# Patient Record
Sex: Female | Born: 1970 | Race: White | Hispanic: No | Marital: Single | Smoking: Never smoker
Health system: Southern US, Community
[De-identification: ages and names within clinical notes are randomized; demographics above are authoritative.]

## PROBLEM LIST (undated history)

## (undated) DIAGNOSIS — Z319 Encounter for procreative management, unspecified: Secondary | ICD-10-CM

## (undated) DIAGNOSIS — E119 Type 2 diabetes mellitus without complications: Secondary | ICD-10-CM

## (undated) DIAGNOSIS — N946 Dysmenorrhea, unspecified: Secondary | ICD-10-CM

## (undated) DIAGNOSIS — E039 Hypothyroidism, unspecified: Secondary | ICD-10-CM

## (undated) HISTORY — PX: CHOLECYSTECTOMY: SHX55

## (undated) HISTORY — PX: GANGLION CYST EXCISION: SHX1691

## (undated) HISTORY — PX: ANKLE SURGERY: SHX546

---

## 2000-07-23 ENCOUNTER — Other Ambulatory Visit: Admission: RE | Admit: 2000-07-23 | Discharge: 2000-07-23 | Payer: Self-pay | Admitting: Obstetrics and Gynecology

## 2000-12-27 ENCOUNTER — Other Ambulatory Visit: Admission: RE | Admit: 2000-12-27 | Discharge: 2000-12-27 | Payer: Self-pay | Admitting: Obstetrics and Gynecology

## 2001-01-29 ENCOUNTER — Encounter (INDEPENDENT_AMBULATORY_CARE_PROVIDER_SITE_OTHER): Payer: Self-pay | Admitting: Specialist

## 2001-01-29 ENCOUNTER — Other Ambulatory Visit: Admission: RE | Admit: 2001-01-29 | Discharge: 2001-01-29 | Payer: Self-pay | Admitting: Obstetrics and Gynecology

## 2001-09-05 ENCOUNTER — Encounter (INDEPENDENT_AMBULATORY_CARE_PROVIDER_SITE_OTHER): Payer: Self-pay | Admitting: Specialist

## 2001-09-05 ENCOUNTER — Encounter (INDEPENDENT_AMBULATORY_CARE_PROVIDER_SITE_OTHER): Payer: Self-pay | Admitting: *Deleted

## 2001-09-05 ENCOUNTER — Ambulatory Visit (HOSPITAL_COMMUNITY): Admission: RE | Admit: 2001-09-05 | Discharge: 2001-09-05 | Payer: Self-pay | Admitting: *Deleted

## 2002-02-07 ENCOUNTER — Other Ambulatory Visit: Admission: RE | Admit: 2002-02-07 | Discharge: 2002-02-07 | Payer: Self-pay | Admitting: Obstetrics and Gynecology

## 2002-06-03 ENCOUNTER — Ambulatory Visit (HOSPITAL_COMMUNITY): Admission: RE | Admit: 2002-06-03 | Discharge: 2002-06-03 | Payer: Self-pay | Admitting: Obstetrics and Gynecology

## 2002-06-03 ENCOUNTER — Encounter: Payer: Self-pay | Admitting: Obstetrics and Gynecology

## 2003-02-26 ENCOUNTER — Other Ambulatory Visit: Admission: RE | Admit: 2003-02-26 | Discharge: 2003-02-26 | Payer: Self-pay | Admitting: Obstetrics and Gynecology

## 2003-09-26 ENCOUNTER — Inpatient Hospital Stay (HOSPITAL_COMMUNITY): Admission: AD | Admit: 2003-09-26 | Discharge: 2003-09-26 | Payer: Self-pay | Admitting: Obstetrics and Gynecology

## 2004-01-13 ENCOUNTER — Inpatient Hospital Stay (HOSPITAL_COMMUNITY): Admission: AD | Admit: 2004-01-13 | Discharge: 2004-01-13 | Payer: Self-pay | Admitting: Obstetrics and Gynecology

## 2004-04-26 ENCOUNTER — Encounter: Admission: RE | Admit: 2004-04-26 | Discharge: 2004-04-26 | Payer: Self-pay | Admitting: *Deleted

## 2004-12-09 ENCOUNTER — Other Ambulatory Visit: Admission: RE | Admit: 2004-12-09 | Discharge: 2004-12-09 | Payer: Self-pay | Admitting: *Deleted

## 2005-07-09 ENCOUNTER — Inpatient Hospital Stay (HOSPITAL_COMMUNITY): Admission: AD | Admit: 2005-07-09 | Discharge: 2005-07-09 | Payer: Self-pay | Admitting: Obstetrics & Gynecology

## 2006-01-26 ENCOUNTER — Ambulatory Visit (HOSPITAL_COMMUNITY): Admission: RE | Admit: 2006-01-26 | Discharge: 2006-01-26 | Payer: Self-pay | Admitting: Obstetrics & Gynecology

## 2006-02-13 ENCOUNTER — Inpatient Hospital Stay (HOSPITAL_COMMUNITY): Admission: AD | Admit: 2006-02-13 | Discharge: 2006-02-16 | Payer: Self-pay | Admitting: Obstetrics and Gynecology

## 2006-08-09 ENCOUNTER — Encounter (INDEPENDENT_AMBULATORY_CARE_PROVIDER_SITE_OTHER): Payer: Self-pay | Admitting: *Deleted

## 2006-08-09 ENCOUNTER — Inpatient Hospital Stay (HOSPITAL_COMMUNITY): Admission: EM | Admit: 2006-08-09 | Discharge: 2006-08-10 | Payer: Self-pay | Admitting: Emergency Medicine

## 2006-08-13 ENCOUNTER — Ambulatory Visit: Payer: Self-pay | Admitting: Gastroenterology

## 2006-09-20 ENCOUNTER — Emergency Department (HOSPITAL_COMMUNITY): Admission: EM | Admit: 2006-09-20 | Discharge: 2006-09-20 | Payer: Self-pay | Admitting: Emergency Medicine

## 2006-10-01 ENCOUNTER — Ambulatory Visit: Payer: Self-pay | Admitting: Gastroenterology

## 2006-10-26 ENCOUNTER — Ambulatory Visit: Payer: Self-pay | Admitting: Gastroenterology

## 2007-10-03 IMAGING — CT CT PELVIS W/ CM
2 of 5 series · 17 of 46 positions shown, 19 images · IV contrast (omnipaque)
Comparison: None

CLINICAL DATA: Abdominal and pelvic pain with increased white count. 
 ABDOMEN CT WITH CONTRAST:
TECHNIQUE: Multidetector CT imaging of the abdomen was performed following the standard protocol during bolus administration of intravenous contrast.
 Contrast:  Oral contrast and 125 cc intravenous Omnipaque 300
TECHNIQUE: Multidetector CT imaging of the pelvis was performed following the standard protocol during bolus administration of intravenous contrast.

[Series 2: abd_pel 5.0 b40f st · axial · 0.69mm/px · z∈[-452,-2]mm · 14 of 101 slices shown, 16 images]
[im 6/101  soft-tissue]
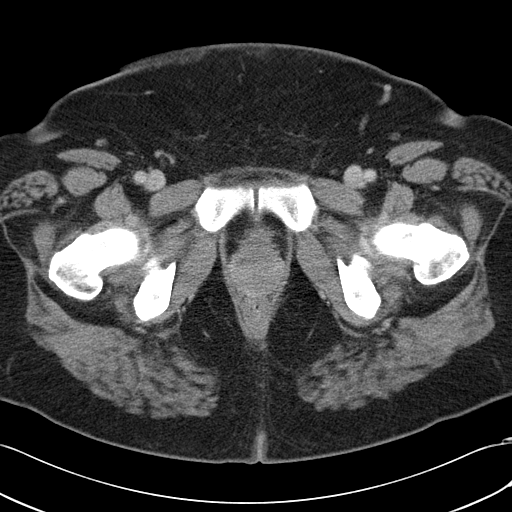
[im 6/101  bone]
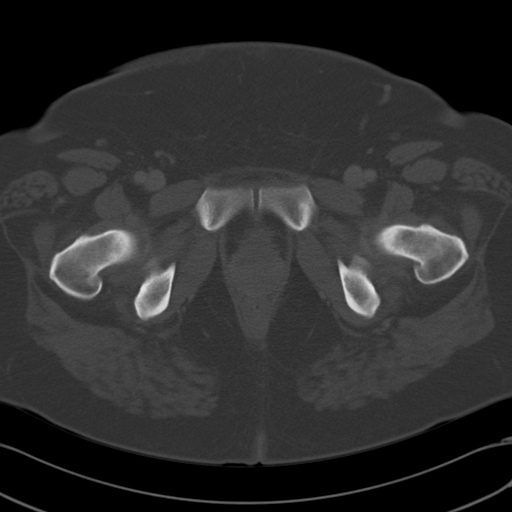
[im 16/101  soft-tissue]
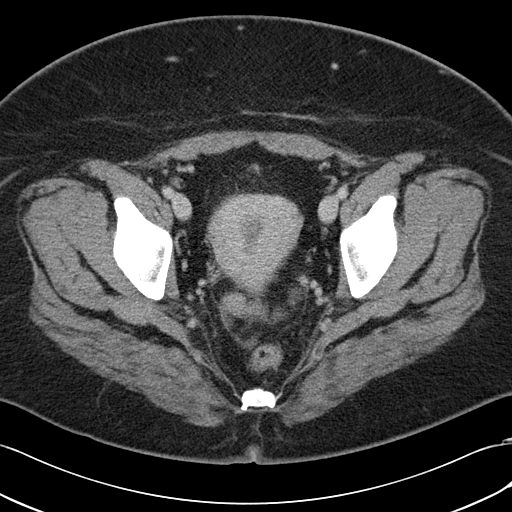
[im 21/101  soft-tissue]
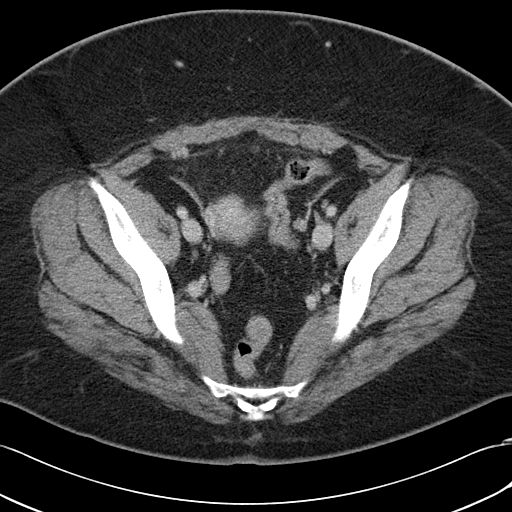
[im 26/101  soft-tissue]
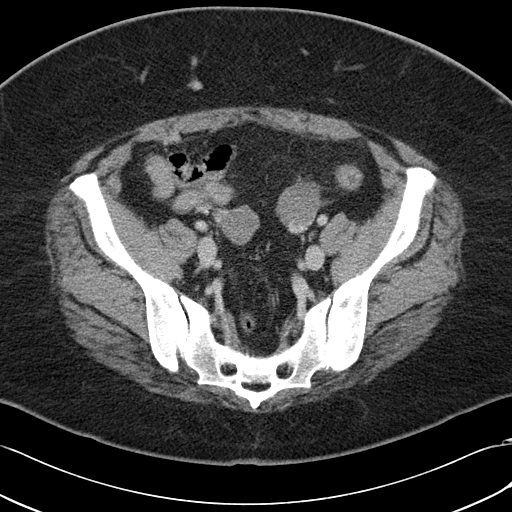
[im 36/101  soft-tissue]
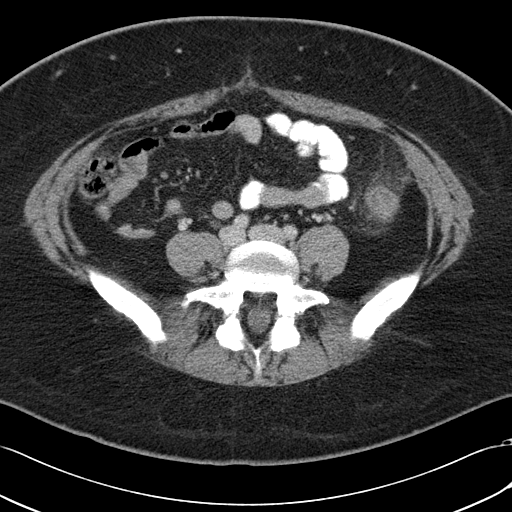
[im 41/101  soft-tissue]
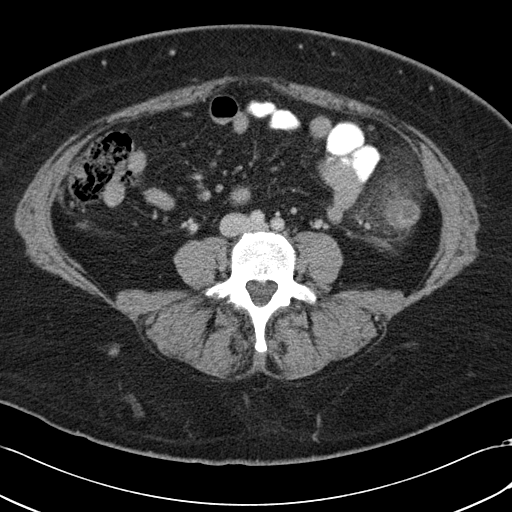
[im 46/101  soft-tissue]
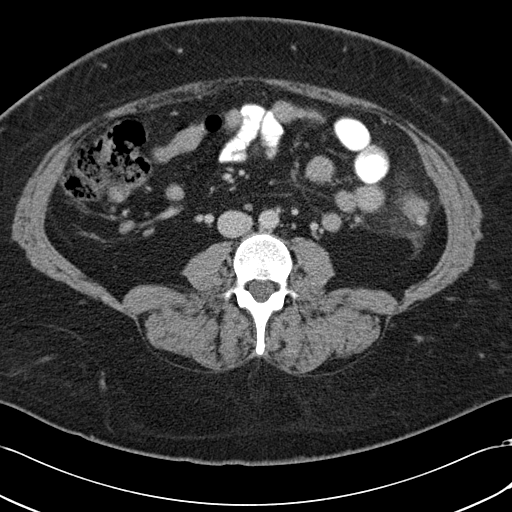
[im 56/101  soft-tissue]
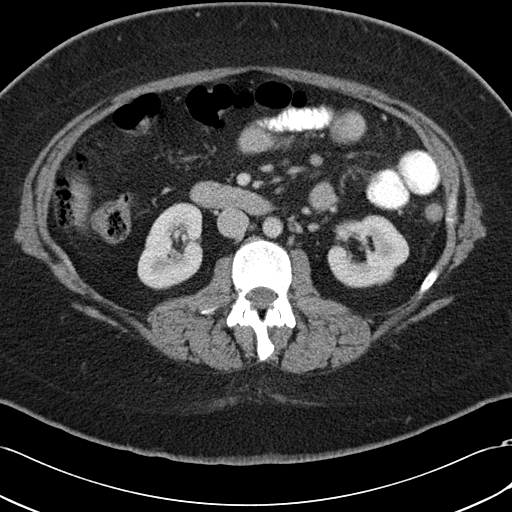
[im 61/101  soft-tissue]
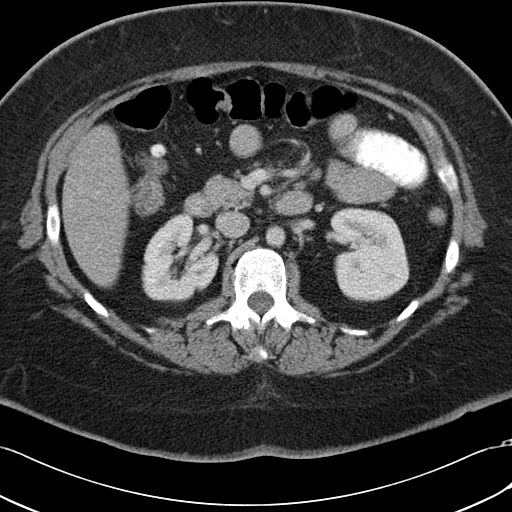
[im 61/101  bone]
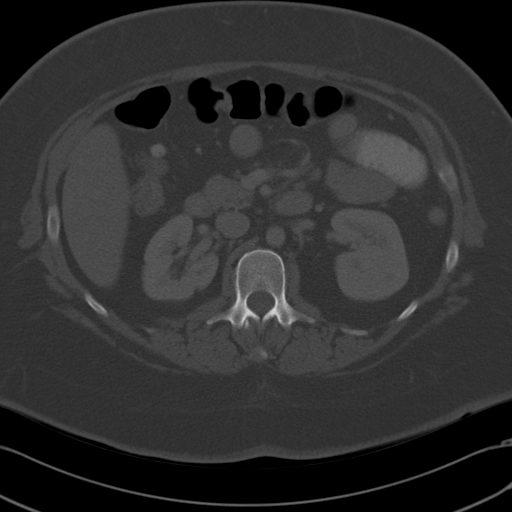
[im 66/101  soft-tissue]
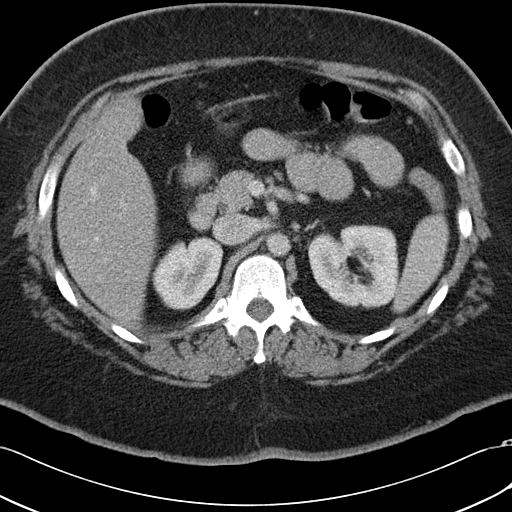
[im 76/101  soft-tissue]
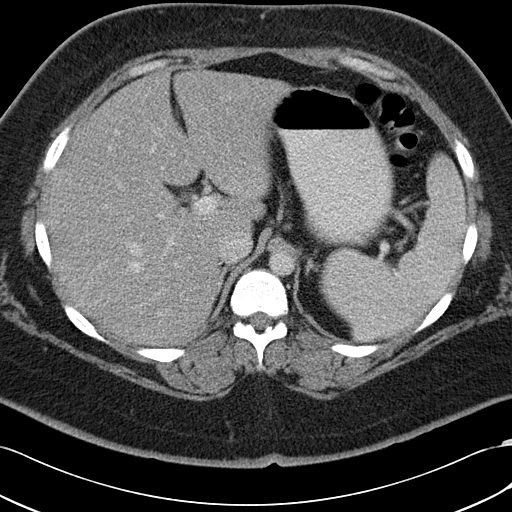
[im 81/101  soft-tissue]
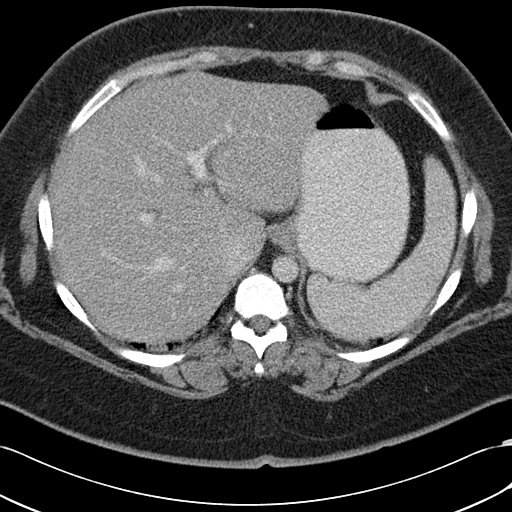
[im 86/101  soft-tissue]
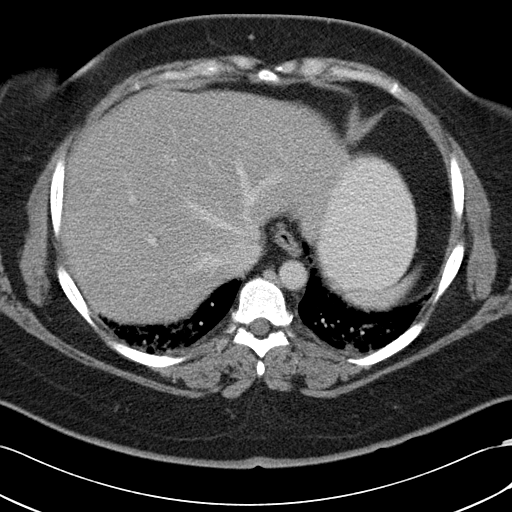
[im 96/101  soft-tissue]
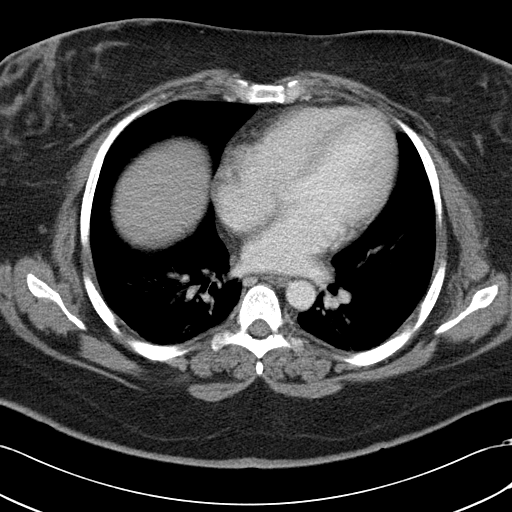

[Series 602: <mpr range> · coronal · 1.03mm/px · 3 of 79 slices shown]
[im 27/79  soft-tissue]
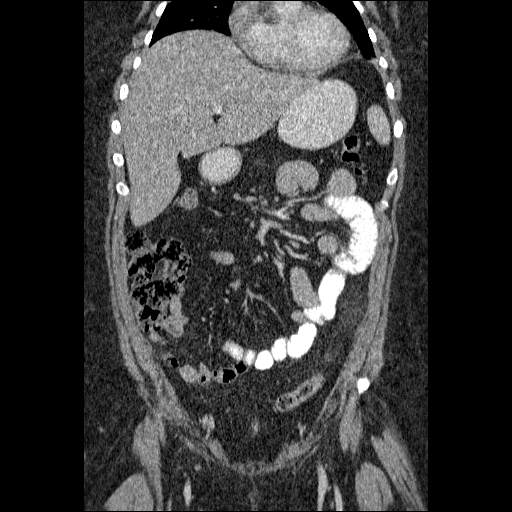
[im 35/79  soft-tissue]
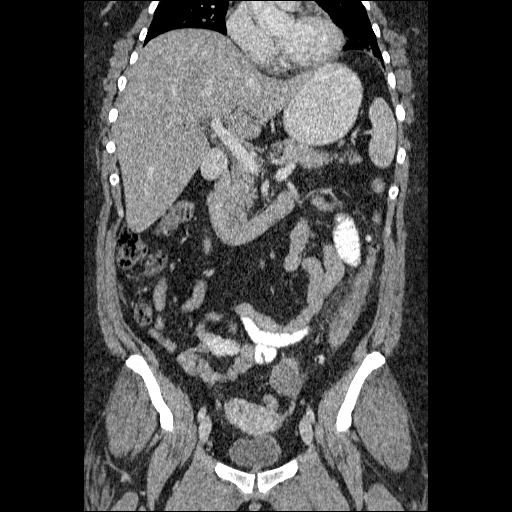
[im 44/79  soft-tissue]
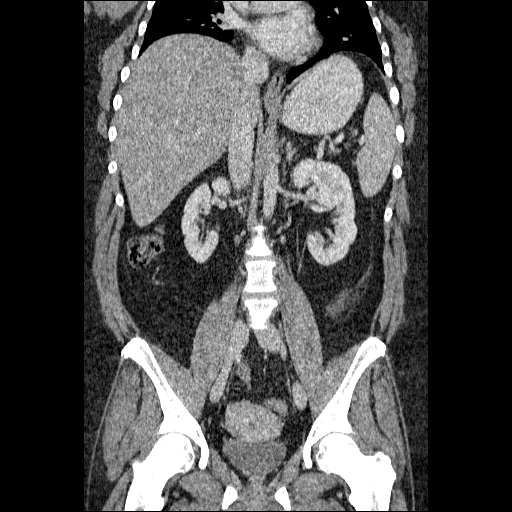

[17 of 46 positions shown; findings below may reference images not displayed]

FINDINGS: Mild bibasilar atelectasis is identified.  Mild fatty infiltration of the liver with mild liver enlargement noted.  The patient is status post cholecystectomy.  The spleen, adrenal glands, kidneys, and pancreas are unremarkable.  There is inflammation and mild wall thickening along the distal descending colon compatible with diverticulitis.  No evidence of pneumoperitoneum, abscess, or bowel obstruction noted.   Multiple colonic diverticula are present.  No evidence of enlarged lymph nodes, free fluid, biliary dilatation, or abdominal aortic aneurysm.  Remainder of the visualized bowel is unremarkable.
IMPRESSION: 1.  Descending colonic diverticulitis without abscess or free air.
 2.  Fatty infiltration of the liver and mild hepatomegaly. 
 PELVIS CT WITH CONTRAST:
FINDINGS: A 2.5 cm right ovarian cyst/follicle is noted.  A tiny amount of free fluid in the pelvis is likely physiologic.  Tiny urachal remnant is present.  Bladder is within normal limits.
 Bilateral L5 pars defects are noted with grade I anterolisthesis of L5 on S1 and severe degenerative disk disease.
IMPRESSION: 1.  No acute abnormality. 
 2.  Bilateral L5 pars defects with grade 1 anterolisthesis of L5 on S1 and severe degenerative disk disease.

## 2010-12-30 NOTE — Discharge Summary (Signed)
Terri [name], Terri [name]               ACCOUNT NO.:  [id number]   MEDICAL RECORD NO.:  [id number]          PATIENT TYPE:  INP   LOCATION:  1322                         FACILITY:  [facility]   PHYSICIAN:  Kari Baars, M.D.  DATE OF BIRTH:  [birth year]   DATE OF ADMISSION:  08/08/2006  DATE OF DISCHARGE:  08/10/2006                               DISCHARGE SUMMARY   DISCHARGE DIAGNOSES:  1. Acute diverticulitis  2. Type 2 diabetes (gestational and postpartum).  3. Hypertension.  4. Hypothyroidism.  5. Recent pregnancy with C-section.  6. Irritable bowel syndrome.  7. Status post knee surgery.   DISCHARGE MEDICATIONS:  1. Cipro 500 mg b.i.d. for 12 days.  2. Flagyl 500 mg t.i.d. for 12 days.  3. Actos 15 mg daily.  4. Synthroid 88 mcg daily.  5. Aldomet 500 mg b.i.d.  6. Hydrochlorothiazide 25 mg - instructed to hold for 1 week and then      resume daily.  7. Vytorin 10/40 - hold until complete antibiotics then resume daily.   HOSPITAL PROCEDURES:  1. CT of the abdomen and pelvis (12/27 descending colonic      diverticulitis without abscess or free air.  Fatty infiltration of      liver with mild hepatomegaly.  Bilateral L5 pars defects with grade      1 anterolisthesis of L5 on S1 and severe degenerative disk disease.   HISTORY OF PRESENT ILLNESS:  For full details please see dictated  history and physical by Dr. Evlyn Kanner.  Briefly, Keron Koffman is a pleasant  40 year old white female with a history of type 2 diabetes that began as  gestational diabetes during her recent pregnancy, hypertension, and  hypothyroidism who presented to the emergency department 12/27 with  acute onset of abdominal pain.  She states that she has had irritable  bowel symptoms for the past week with intermittent abdominal pain.  However, on the day of presentation.  She had a severe onset of left  lower quadrant abdominal pain which was severe in nature.  Associated  symptoms included fever, nausea, bowel  movements were normal with no  apparent blood.  She came to the emergency department where she was  found to have an elevated white blood count of 22,000.  Urine pregnancy  test and lipase were also negative.  Urinalysis normal.  CT scan was  performed with results as above consistent with acute diverticulitis.  Given the severity of her abdominal pain and CT findings.  The patient  was admitted for further management.   HOSPITAL COURSE:  The patient was admitted to a medical bed and treated  for acute diverticulitis.  She was placed on IV Cipro and Flagyl.  She  was placed on a clear liquid diet.  Given the atypical presentation at a  young age, GI was consulted.  They agreed with the diagnosis and plan an  outpatient evaluation once her symptoms size with a colonoscopy.  Interestingly, she states that her mother had onset of diverticulitis in  her 30s.  She does have a brother with Crohn's disease.  She has not  had  any other abnormal GI symptoms, weight loss, or diarrhea to suggest  inflammatory bowel disease.  With antibiotic therapy and bowel rest, her  symptoms rapidly improved.  On the day following admission she was  tolerating an advanced diet and oral antibiotics.  Her abdominal  tenderness had resolved significantly.  Her leukocytosis also improved.  Therefore, she was felt stable for discharge home to complete an  outpatient course of p.o. antibiotics.  Following completion of her  antibiotics, she will undergo colonoscopy at [facility].   DISCHARGE LABS:  On the day of discharge white count 11.4, hemoglobin  12, platelets 279.  BMP showed a sodium 137, potassium 3.4, chloride  103, bicarb 27, BUN 2, creatinine 0.7, glucose 131.  A1c was 7.   DISPOSITION:  The patient was discharged to home.   DISCHARGE INSTRUCTIONS:  She was instructed to call if she has a fever  above 101.5, increasing abdominal pain, inability to tolerate p.o.  She  should monitor her sugars and resume  her Actos.  She was instructed to  hold her HCTZ until her dehydration resolved completely.   DISCHARGE DIET:  Diabetic diet as tolerated.  She was instructed to  avoid high residue foods including seeds, nuts, corn/popcorn.   HOSPITAL FOLLOW-UP:  She will follow up with Dr. Arlyce Dice within the next  1-2 weeks.  She should follow-up with Dr. Evlyn Kanner in 2 weeks as well.      Kari Baars, M.D.  Electronically Signed     WS/MEDQ  D:  08/10/2006  T:  08/11/2006  Job:  [id number]   cc:   Jeannett Senior A. Evlyn Kanner, M.D.  Fax: [phone]   Barbette Hair. Arlyce Dice, MD,FACG  [address]  [place]  [place] [address]

## 2010-12-30 NOTE — H&P (Signed)
Terri [name], Terri [name]               ACCOUNT NO.:  [id number]   MEDICAL RECORD NO.:  [id number]          PATIENT TYPE:  INP   LOCATION:  1322                         FACILITY:  [facility]   PHYSICIAN:  Tera Mater. [place], M.D. DATE OF BIRTH:  13-Oct-1970   DATE OF ADMISSION:  08/08/2006  DATE OF DISCHARGE:                              HISTORY & PHYSICAL   HISTORY OF PRESENT ILLNESS:  This is a 40 year old white female with a  history of type 2 diabetes, hypertension, hypothyroidism who presents  for evaluation.  She had been sick for roughly one week and thought her  irritable bowel syndrome had been acting up.  She had intermittent  abdominal pain, more on the left side but going across the abdomen.  This was a fleeting pain for a few minutes at a time.  She had some  general malaise.  Bowels were unpredictable as usual.  She had seen no  blood in her bowels.  Starting yesterday, however the pain intensified  and did not go away.  She was doubled over with pain and had some  fever.  She went to the urgent care and had x-rays and labs reportedly  with findings of a leukocytosis.  She was sent here and has been  evaluated in the Emergency Room, had a CT scan suggesting  diverticulitis.  The patient has not thrown up any but was nauseated.  She has had no breathing trouble, no chest pain, no neurologic symptoms.  She has had general weakness and feeling bad.  Her p.o. intake has been  down.  She denies any other new symptoms.  She has never had any GI  investigation or GI problems.  At the present time she has had pain  medications and is feeling quite a bit better.   PAST MEDICAL HISTORY:  Includes a recent pregnancy that ended on 02/13/06.  She has had gestational and type 2 diabetes.  She is on oral agents now.  She has chronic hypertension, both pre and during pregnancy.  She has  hypothyroidism.  She has previously had in vitro fertilization.  She had  C section with this last surgery.  She  has irritable bowel syndrome.  She has had a procedure on her knee.   FAMILY HISTORY:  Positive for diabetes, heart disease and coronary  disease.   MEDICATIONS:  At present include Synthroid 88 mcg; Aldomet 500 twice  daily; hydrochlorothiazide 25 daily; prenatal vitamin and Vytorin 10/40.   ALLERGIES:  None.   SOCIAL HISTORY:  Reveals she is married with two children.   PHYSICAL EXAMINATION:  VITAL SIGNS:  Blood pressure 130/77, pulse 98,  respirations 20, temperature 98.2.  In the Emergency Room at  presentation, temperature was 99.4.  O2 sat is 91%.  Weight is 114 kilo  with a height of 6 foot 2 inches.  GENERAL:  We have a fairly healthy, non-toxic appearing white female  lying quietly in bed, no distress.  Acne rosacea is present.  Sclerae  anicteric.  Extraocular movements intact.  Oral mucous membranes are  moist.  NECK:  Supple without bruits.  No thyromegaly is present.  LUNGS:  Clear without wheezes, rales, or rhonchi.  No accessory muscles  in use.  HEART:  Regular with a soft flow murmur 1/6.  ABDOMEN:  Mildly distended.  She has hypoactive bowel sounds but present  in all four quadrants.  She is tender, especially in the left lower and  left mid abdomen and along the left side.  She does have discomfort when  movement is noted.  EXTREMITIES:  Strong distal pulses without edema.  NEUROLOGIC:  The patient is awake and alert, mentating perfectly well.  Speech is clear.  No tremors present.  No evidence of hallucinations or  delusions present.   LABORATORY DATA:  White count is 22,300, hemoglobin 15.0, platelets  415,000.  MCV 86.6.  Her sodium is 138, potassium 3.1, chloride 97, CO2  31, BUN 7, creatinine 0.8, glucose 150.  Total bilirubin is 0.4.  Alkaline phosphatase is 58.  SGOT is 25, SGPT 27, total protein 8,  albumin 3.9, calcium 9.6, lipase 31.  Urine pregnancy test is negative.  Urinalysis was negative.   CT scan of the abdomen and pelvis reveals descending  colonic  diverticulitis without abscess or free air, fatty infiltration of the  liver, mild hepatomegaly is noted.  There is an ovarian cyst at 2.5 cm  also noted.  There is also degenerative joint disease at L-5 and S-1.   SUMMARY:  In summary we have a 40 year old white female presenting with  acute diverticulitis.  She has already received Cipro and Flagyl and  this will be continued.  White count will be followed serially.  I do  not see any indication for surgical consult at this time.  Will set her  up with GI consult because she will need some colonic investigation  after this has settled down some.  She will be kept NPO for at least the  next 24 hours.  A sliding scale of insulin will be used.  Her Aldomet  will be continued.  Her Synthroid will be continued.  Will hold the  Vytorin and hydrochlorothiazide.  Cipro and Flagyl will be used as  antibiotic choices.  She will have her diet advanced based on clinical  situation.           ______________________________  Tera Mater Evlyn Kanner, M.D.     SAS/MEDQ  D:  08/09/2006  T:  08/09/2006  Job:  [id number]

## 2010-12-30 NOTE — Letter (Signed)
October 01, 2006    Jeannett Senior A. Evlyn Kanner, M.D.  [address]  [place], [place] [address]   RE:  RYLEEANN, URQUIZA  MRN:  [id number]  /  DOB:  [birth year]   Dear Dr. Evlyn Kanner:   Upon your kind referral, I had the pleasure of evaluating your patient  and I am pleased to offer my findings.  I saw Chundra Sauerwein in the  office today.  Enclosed is a copy of my progress note that details my  findings and recommendations.   Thank you for the opportunity to participate in your patient's care.    Sincerely,      Barbette Hair. Arlyce Dice, MD,FACG  Electronically Signed    RDK/MedQ  DD: 10/01/2006  DT: 10/01/2006  Job #: [id number]

## 2010-12-30 NOTE — Discharge Summary (Signed)
NAMECODI, FOLKERTS NO.:  [id number]   MEDICAL RECORD NO.:  [id number]          PATIENT TYPE:  INP   LOCATION:                                FACILITY:  [facility]   PHYSICIAN:  Miguel Aschoff, M.D.            DATE OF BIRTH:   DATE OF ADMISSION:  02/13/2006  DATE OF DISCHARGE:  02/16/2006                                 DISCHARGE SUMMARY   FINAL DIAGNOSES:  1.  Intrauterine pregnancy at 37+ weeks gestation.  2.  Insulin dependent gestational diabetes mellitus.  3.  Chronic hypertension.  4.  Hypothyroidism.  5.  Advanced maternal age.  6.  History of in vitro fertilization.  7.  Failed induction of labor.   PROCEDURE:  Primary low flap transverse cesarean section.   SURGEON:  Dr. Miguel Aschoff.   COMPLICATIONS:  None.   This 40 year old G-3, P-0-0-2-0 presents at 37-4/[redacted] weeks gestation for  induction of labor.  The patient has insulin-dependent diabetes and she had  been having a BPPs performed in the office twice weekly, which were all  normal.  The patient's blood sugars were in pretty good control with her  insulin regimen.  The patient also has a history of chronic hypertension.  She was on Aldomet during this pregnancy and blood pressures were stable.  The patient is also advanced maternal age.  She also has hypothyroidism  and  was on  Synthroid and had her TSHs and T4s checked throughout this  pregnancy.  The patient was admitted for induction of labor and was started  on IV Pitocin.  She continued on this throughout the day with no change in  her cervix.  After 10 hours, she was only fingertip dilated and -2 station.  Because of this prolonged induction and multiple medical problems, a  discussion was held with the patient and her husband and a decision was made  to proceed with a cesarean section.  She was taken to the operating room on  February 13, 2006 by Dr. Miguel Aschoff, where a primary low flap transverse cesarean  section was performed with the delivery of  an 8 pound 2 ounce female infant  with Apgars of 8 and 9.  Delivery went without complications.  The patient's  postoperative course was benign without any significant fevers.  She was on  the Glucommander postoperatively.  By postoperative day #3, the patient was  felt ready for discharge and was sent home on a regular diet, told to  decrease her activities and was started on Actos 15 mg daily for elevated  blood sugars, was given Tylox 1 every 3 hours as needed for pain, told she  could use Motrin up to 600 mg every 6 hours as  needed for pain, told to take iron for some postoperative anemia and was to  follow up in our office in a week.  Of course to call with any increased  bleeding, pain or problems.   LABS ON DISCHARGE:  The patient had a hemoglobin of 9.6, white blood cell  count of 10.6,  platelets of 254,000.      Leilani Able, P.A.-C.      Miguel Aschoff, M.D.  Electronically Signed    MB/MEDQ  D:  03/12/2006  T:  03/12/2006  Job:  [id number]

## 2010-12-30 NOTE — Letter (Signed)
October 01, 2006    Terri [name]   RE:  Terri [name], Terri [name]  MRN:  [id number]  /  DOB:  04-12-1971   Dear [name]:   It is my pleasure to have treated you recently as a new patient in my  office.  I appreciate your confidence and the opportunity to participate  in your care.   Since I do have a busy inpatient endoscopy schedule and office schedule,  my office hours vary weekly.  I am, however, available for emergency  calls every day through my office.  If I cannot promptly meet an urgent  office appointment, another one of our gastroenterologists will be able  to assist you.   My well-trained staff are prepared to help you at all times.  For  emergencies after office hours, a physician from our gastroenterology  section is always available through my 24-hour answering service.   While you are under my care, I encourage discussion of your questions  and concerns, and I will be happy to return your calls as soon as I am  available.   Once again, I welcome you as a new patient and I look forward to a happy  and healthy relationship.    Sincerely,      Barbette Hair. Arlyce Dice, MD,FACG  Electronically Signed   RDK/MedQ  DD: 10/01/2006  DT: 10/01/2006  Job #: [id number]

## 2010-12-30 NOTE — Assessment & Plan Note (Signed)
[name]                         GASTROENTEROLOGY OFFICE NOTE   NAME:Terri [name], [name]                      MRN:          [id number]  DATE:10/01/2006                            DOB:          1970/12/24    REASON FOR CONSULTATION:  Abdominal pain.   Mrs. Terri Terri [name] is a pleasant, 40 year old, white female referred through  the courtesy of Dr. Evlyn Terri [name] for evaluation.  In December 2007, she was  hospitalized with acute diverticulitis.  She has a history of IBS  characterized by constipation alternating with diarrhea.  Diverticulitis  was diagnosed by CT.  Since her discharge, she has done well.  She did  develop recurrent abdominal pain, recurrent back pain which was  determined to be due to the stress fracture.  She currently is without  abdominal pain.  There is no history of melena or hematochezia.  She  apparently underwent colonoscopy approximately 5 years ago.   FAMILY HISTORY:  Pertinent for a grandfather with colon cancer and  father, and 24 year old brother with colon polyps.  Another brother has  Crohn's disease.   PAST MEDICAL HISTORY:  Pertinent for hypertension and diabetes and  thyroid disease.  She is status post cholecystectomy.  She has  depression.  Family history is as stated above.  Mother has heart  disease.   MEDICATIONS:  Include Synthroid, Actos, Methyldopa, Vytorin,  hydrochlorothiazide.   She has no allergies.   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   REVIEW OF SYSTEMS:  Positive for back pain.   EXAM:  Pulse 90.  Blood pressure 122/84.  Weight 250.  HEENT: EOMI. PERRLA. Sclerae are anicteric.  Conjunctivae are pink.  NECK:  Supple without thyromegaly, adenopathy or carotid bruits.  CHEST:  Clear to auscultation and percussion without adventitious  sounds.  CARDIAC:  Regular rhythm; normal S1 S2.  There are no murmurs, gallops  or rubs.  ABDOMEN:  Bowel sounds are normoactive.  Abdomen is soft, non-tender and  non-distended.  There are no abdominal masses, tenderness, splenic  enlargement or hepatomegaly.  EXTREMITIES:  Full range of motion.  No cyanosis, clubbing or edema.  RECTAL:  Deferred.   IMPRESSION:  62. A 41 year old female with recent history of acute diverticulitis.  2. Family history of colon cancer and colon polyps and Crohn's      disease.  3. Irritable bowel syndrome.   RECOMMENDATIONS:  1. Fiber supplementation.  2. Colonoscopy to determine extent and the severity of her      diverticular disease and to rule out colon polyps.     Terri Terri [name]. Terri Dice, MD,FACG  Electronically Signed    RDK/MedQ  DD: 10/01/2006  DT: 10/01/2006  Job #: [id number]   cc:   Terri Terri [name] A. Terri Terri [name], M.D.

## 2010-12-30 NOTE — Op Note (Signed)
NAME:  Terri [name], FRANE               ACCOUNT NO.:  [id number]   MEDICAL RECORD NO.:  [id number]          PATIENT TYPE:  INP   LOCATION:  9122                          FACILITY:  [facility]   PHYSICIAN:  Miguel Aschoff, M.D.       DATE OF BIRTH:  04/09/1971   DATE OF PROCEDURE:  02/13/2006  DATE OF DISCHARGE:                                 OPERATIVE REPORT   PREOPERATIVE DIAGNOSIS:  Intrauterine pregnancy at 37+ weeks, insulin  dependent diabetes mellitus, chronic hypertension, hypothyroidism, pregnancy  as a result en vitro fertilization, advanced maternal age, failed induction  of labor.   POSTOPERATIVE DIAGNOSIS:  Intrauterine pregnancy at 37+ weeks, insulin  dependent diabetes mellitus, chronic hypertension, hypothyroidism, pregnancy  as a result en vitro fertilization, advanced maternal age, failed induction  of labor, with delivery of a female infant Apgars 8/9.   PROCEDURE:  Primary low flap transverse cesarean section.   SURGEON:  Miguel Aschoff, M.D.   ANESTHESIA:  Spinal.   COMPLICATIONS:  None.   JUSTIFICATION:  The patient is a 40 year old white female gravida 3, para 0-  0-2-0, at 6 and 4/7 weeks who was admitted for induction of labor. The  patient is an insulin dependent diabetic also with chronic hypertension and  hypothyroidism.  The patient was started on the intravenous Pitocin and was  continued on Pitocin throughout the day with essentially no change in her  cervix. After 10 hours of Pitocin, she was still only fingertip dilated, 30%  effaced, and -2 station.  Because of her multiple medical problems, the  option of proceeding with a second day of induction versus cesarean section  were discussed with the patient and her husband and they have opted at this  point to undergo primary cesarean section.  The risks and benefits were  discussed.   PROCEDURE:  The patient is taken to the operating room, placed in the  sitting position, and spinal anesthesia was administered  without difficulty.  She was then placed in supine position, deviated to the left, prepped and  draped in the usual sterile fashion.  A Foley catheter was inserted.  A  Pfannenstiel incision was then made and extended down through the  subcutaneous tissue with bleeding points being found, clamped and coagulated  as they were encountered.  The fascia was then found, incised transversely,  and then separated from the underlying rectus muscles.  The rectus muscles  were divided in the midline.  The peritoneum was then found and entered  carefully avoiding underlying structures, then extended under direct  visualization.  The bladder flap was created and then protected with the  bladder blade.  An elliptical transverse incision was then made into the  lower uterine segment and the amniotic cavity was entered, clear fluid was  obtained. At this point, the patient was delivered of a viable female infant  Apgar 8 at 1 minute and 9 at 5 minutes from a vertex ROA position.  The baby  weighed 8 pounds 2 ounces.  It was noted after evaluation by the  neonatologist that the umbilical cord only contained two  vessels.  There  were no dysmorphic stigmata noted.  The placenta was then delivered.  The  uterus was evacuated of remaining products of conception.  The angles of the  uterine incision were then ligated using figure-of-eight sutures of #1  Vicryl.  The uterus was closed in layers, the first layer was a running  interlocking suture of #1 Vicryl followed by an imbricating suture of #1  Vicryl.  The bladder flap was reapproximated using running continuous 2-0  Vicryl suture.  At this point, with good hemostasis, the lap counts and  instrument counts were checked and found to be correct, the abdomen was  irrigated with warm saline and then closed.  The parietal peritoneum was  closed using running continuous 0 Vicryl suture.  The rectus muscles were  reapproximated using running continuous 0 Vicryl  suture.  The fascia was  closed using two sutures of 0 Vicryl each starting at the lateral fascial  angles and meeting in midline.  The subcutaneous tissues were closed using  interrupted 0 Vicryl.  The skin incision was closed using staples.  Estimated blood loss was approximately 600 mL.  The patient tolerated the  procedure well and went to recovery in satisfactory condition.  The baby was  taken to the nursery in satisfactory condition.   The plan is for the mother to remain on the Glucocomander protocol in the  immediate postoperative period and then change back to insulin when on a  regular diet.      Miguel Aschoff, M.D.  Electronically Signed     AR/MEDQ  D:  02/13/2006  T:  02/13/2006  Job:  [id number]

## 2015-03-08 ENCOUNTER — Other Ambulatory Visit (HOSPITAL_COMMUNITY)
Admission: RE | Admit: 2015-03-08 | Discharge: 2015-03-08 | Disposition: A | Discharge status: Home / Self Care | Payer: Self-pay | Source: Ambulatory Visit | Attending: Family Medicine | Admitting: Family Medicine

## 2015-03-08 DIAGNOSIS — Z1151 Encounter for screening for human papillomavirus (HPV): Secondary | ICD-10-CM | POA: Insufficient documentation

## 2015-03-08 DIAGNOSIS — Z01411 Encounter for gynecological examination (general) (routine) with abnormal findings: Secondary | ICD-10-CM | POA: Insufficient documentation

## 2015-09-27 ENCOUNTER — Encounter (HOSPITAL_COMMUNITY): Payer: Self-pay | Admitting: *Deleted

## 2015-09-27 ENCOUNTER — Inpatient Hospital Stay (HOSPITAL_COMMUNITY): Payer: Self-pay

## 2015-09-27 ENCOUNTER — Inpatient Hospital Stay (HOSPITAL_COMMUNITY)
Admission: AD | Admit: 2015-09-27 | Discharge: 2015-09-27 | Disposition: A | Discharge status: Home / Self Care | Payer: Self-pay | Source: Ambulatory Visit | Attending: Family Medicine | Admitting: Family Medicine

## 2015-09-27 DIAGNOSIS — K589 Irritable bowel syndrome without diarrhea: Secondary | ICD-10-CM | POA: Insufficient documentation

## 2015-09-27 DIAGNOSIS — R1032 Left lower quadrant pain: Secondary | ICD-10-CM | POA: Insufficient documentation

## 2015-09-27 DIAGNOSIS — E039 Hypothyroidism, unspecified: Secondary | ICD-10-CM | POA: Insufficient documentation

## 2015-09-27 DIAGNOSIS — E119 Type 2 diabetes mellitus without complications: Secondary | ICD-10-CM | POA: Insufficient documentation

## 2015-09-27 HISTORY — DX: Dysmenorrhea, unspecified: N94.6

## 2015-09-27 HISTORY — DX: Type 2 diabetes mellitus without complications: E11.9

## 2015-09-27 HISTORY — DX: Hypothyroidism, unspecified: E03.9

## 2015-09-27 HISTORY — DX: Encounter for procreative management, unspecified: Z31.9

## 2015-09-27 LAB — CBC
HCT: 34.4 % — ABNORMAL LOW (ref 36.0–46.0)
Hemoglobin: 11 g/dL — ABNORMAL LOW (ref 12.0–15.0)
MCH: 26.2 pg (ref 26.0–34.0)
MCHC: 32 g/dL (ref 30.0–36.0)
MCV: 81.9 fL (ref 78.0–100.0)
PLATELETS: 319 10*3/uL (ref 150–400)
RBC: 4.2 MIL/uL (ref 3.87–5.11)
RDW: 16 % — AB (ref 11.5–15.5)
WBC: 13.7 10*3/uL — AB (ref 4.0–10.5)

## 2015-09-27 LAB — URINALYSIS, ROUTINE W REFLEX MICROSCOPIC
Bilirubin Urine: NEGATIVE
GLUCOSE, UA: NEGATIVE mg/dL
Ketones, ur: NEGATIVE mg/dL
LEUKOCYTES UA: NEGATIVE
Nitrite: NEGATIVE
PH: 5.5 (ref 5.0–8.0)
Protein, ur: NEGATIVE mg/dL

## 2015-09-27 LAB — URINE MICROSCOPIC-ADD ON

## 2015-09-27 LAB — POCT PREGNANCY, URINE: Preg Test, Ur: NEGATIVE

## 2015-09-27 MED ORDER — METOCLOPRAMIDE HCL 10 MG PO TABS: 10.0000 mg | ORAL_TABLET | Freq: Four times a day (QID) | ORAL | Status: AC

## 2015-09-27 NOTE — MAU Provider Note (Signed)
History     CSN: [id number]  Arrival date and time: 09/27/15 4098   First Provider Initiated Contact with Patient 09/27/15 1109       Chief Complaint  Patient presents with  . Abdominal Pain   HPI Comments: Terri [name] is a 45 y.o. Female who presents with LLQ pain.  Pt concerned b/c she had vaginal bleeding 1+ week ago & passed a polyp while at work. 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 Bleeding has subsided since then. Had normal pap smear last year with PCP.   Abdominal Pain This is a new problem. Episode onset: x3 days. The problem occurs constantly. The problem has been unchanged. The pain is located in the LLQ. The pain is at a severity of 3/10. The quality of the pain is sharp. The abdominal pain does not radiate. Associated symptoms include diarrhea (Pt states d/t her IBS) and nausea. Pertinent negatives include no anorexia, constipation, dysuria, fever, frequency, hematuria or vomiting. The pain is aggravated by movement. The pain is relieved by nothing. She has tried nothing for the symptoms. Her past medical history is significant for abdominal surgery and irritable bowel syndrome (states pain is different than pain r/t her IBS). There is no history of Crohn's disease.    OB History    Gravida Para Term Preterm AB TAB SAB Ectopic Multiple Living   Past Medical History  Diagnosis Date  . Diabetes mellitus without complication (HCC)   . Hypothyroidism   . Dysmenorrhea   . Infertility management     Past Surgical History  Procedure Laterality Date  . Cesarean section    . Cholecystectomy    . Ankle surgery    . Ganglion cyst excision      Family History  Problem Relation Age of Onset  . Diabetes Mother   . Heart disease Mother   . Hypertension Mother   . Diabetes Father   . Hypertension Father   . Diabetes Brother   . Hypertension Brother   . Diabetes Paternal Grandmother   . Hypertension  Paternal Grandmother     Social History  Substance Use Topics  . Smoking status: Never Smoker   . Smokeless tobacco: None  . Alcohol Use: No    Allergies: No Known Allergies  Prescriptions prior to admission  Medication Sig Dispense Refill Last Dose  . FLUoxetine (PROZAC) 20 MG capsule Take 20 mg by mouth daily.   09/27/2015 at Unknown time  . Liraglutide (VICTOZA) 18 MG/3ML SOPN Inject 1.8 mg into the skin daily.    09/26/2015 at Unknown time  . lisinopril (PRINIVIL,ZESTRIL) 10 MG tablet Take 10 mg by mouth daily.   09/27/2015 at Unknown time  . SitaGLIPtin-MetFORMIN HCl (JANUMET XR) [phone] MG TB24 Take 1 tablet by mouth daily.   09/27/2015 at Unknown time    Review of Systems  Constitutional: Negative.  Negative for fever.  Gastrointestinal: Positive for nausea, abdominal pain and diarrhea (Pt states d/t her IBS). Negative for vomiting, constipation, blood in stool and anorexia.  Genitourinary: Negative.  Negative for dysuria, frequency, hematuria and flank pain.   Physical Exam   Blood pressure 155/88, pulse 97, temperature 98.3 F (36.8 C), temperature source Oral, resp. rate 20, weight 216 lb 12.8 oz (98.34 kg).  Physical Exam  Nursing note and vitals reviewed. Constitutional: She is oriented to person, place, and time. She appears well-developed and  well-nourished. No distress.  HENT:  Head: Normocephalic and atraumatic.  Eyes: Conjunctivae are normal. Right eye exhibits no discharge. Left eye exhibits no discharge. No scleral icterus.  Neck: Normal range of motion.  Cardiovascular: Normal rate, regular rhythm and normal heart sounds.   No murmur heard. Respiratory: Effort normal and breath sounds normal. No respiratory distress. She has no wheezes.  GI: Soft. Bowel sounds are normal. There is tenderness in the left lower quadrant. There is no rigidity, no rebound, no guarding and no tenderness at McBurney's point.  Genitourinary: Vagina normal and uterus normal. Cervix  exhibits no motion tenderness, no discharge and no friability. Right adnexum displays no mass and no tenderness. Left adnexum displays tenderness.  Neurological: She is alert and oriented to person, place, and time.  Skin: Skin is warm and dry. She is not diaphoretic.  Psychiatric: She has a normal mood and affect. Her behavior is normal. Judgment and thought content normal.    MAU Course  Procedures Results for orders placed or performed during the hospital encounter of 09/27/15 (from the past 24 hour(s))  Urinalysis, Routine w reflex microscopic (not at [facility])     Status: Abnormal   Collection Time: 09/27/15 10:10 AM  Result Value Ref Range   Color, Urine YELLOW YELLOW   APPearance HAZY (A) CLEAR   Specific Gravity, Urine >1.030 (H) 1.005 - 1.030   pH 5.5 5.0 - 8.0   Glucose, UA NEGATIVE NEGATIVE mg/dL   Hgb urine dipstick TRACE (A) NEGATIVE   Bilirubin Urine NEGATIVE NEGATIVE   Ketones, ur NEGATIVE NEGATIVE mg/dL   Protein, ur NEGATIVE NEGATIVE mg/dL   Nitrite NEGATIVE NEGATIVE   Leukocytes, UA NEGATIVE NEGATIVE  Urine microscopic-add on     Status: Abnormal   Collection Time: 09/27/15 10:10 AM  Result Value Ref Range   Squamous Epithelial / LPF 0-5 (A) NONE SEEN   WBC, UA 0-5 0 - 5 WBC/hpf   RBC / HPF 0-5 0 - 5 RBC/hpf   Bacteria, UA MANY (A) NONE SEEN   Urine-Other MUCOUS PRESENT   Pregnancy, urine POC     Status: None   Collection Time: 09/27/15 10:21 AM  Result Value Ref Range   Preg Test, Ur NEGATIVE NEGATIVE  CBC     Status: Abnormal   Collection Time: 09/27/15 11:48 AM  Result Value Ref Range   WBC 13.7 (H) 4.0 - 10.5 K/uL   RBC 4.20 3.87 - 5.11 MIL/uL   Hemoglobin 11.0 (L) 12.0 - 15.0 g/dL   HCT 16.1 (L) 09.6 - 04.5 %   MCV 81.9 78.0 - 100.0 fL   MCH 26.2 26.0 - 34.0 pg   MCHC 32.0 30.0 - 36.0 g/dL   RDW 40.9 (H) 81.1 - 91.4 %   Platelets 319 150 - 400 K/uL   US Transvaginal Non-ob  09/27/2015  CLINICAL DATA:  Left lower quadrant abdominal pain, irregular  bleeding, patient states she passed a polyp vaginal a 1 week ago which was evaluated histologically and reportedly benign EXAM: TRANSABDOMINAL AND TRANSVAGINAL ULTRASOUND OF PELVIS TECHNIQUE: Both transabdominal and transvaginal ultrasound examinations of the pelvis were performed. Transabdominal technique was performed for global imaging of the pelvis including uterus, ovaries, adnexal regions, and pelvic cul-de-sac. It was necessary to proceed with endovaginal exam following the transabdominal exam to visualize the endometrium and ovaries. COMPARISON:  08/09/2006 FINDINGS: Uterus Measurements: 8.5 x 4.0 x 4.4 cm. No evidence of discrete mass. Heterogeneous echotexture. Endometrium Thickness: 11 mm. Ill-defined and heterogeneous diffusely with small  cystic components in the endometrium of the lower uterine segment. Endometrium in the lower uterine segment is vascular. Right ovary Not visualized Left ovary Measurements: 36 x 22 x 15 mm. Normal appearance/no adnexal mass. Other findings Small volume free fluid IMPRESSION: Sonographically heterogeneous myometrium suggesting possibility of diffuse fibroid involvement versus adenomyosis Irregular heterogeneous endometrium with hyperemia of the endometrium in the lower uterine segment. Differential diagnostic possibilities include hyperplasia, polyp, or carcinoma. Electronically Signed   By: Esperanza Heir M.D.   On: 09/27/2015 13:12   US Pelvis Complete  09/27/2015  CLINICAL DATA:  Left lower quadrant abdominal pain, irregular bleeding, patient states she passed a polyp vaginal a 1 week ago which was evaluated histologically and reportedly benign EXAM: TRANSABDOMINAL AND TRANSVAGINAL ULTRASOUND OF PELVIS TECHNIQUE: Both transabdominal and transvaginal ultrasound examinations of the pelvis were performed. Transabdominal technique was performed for global imaging of the pelvis including uterus, ovaries, adnexal regions, and pelvic cul-de-sac. It was necessary to  proceed with endovaginal exam following the transabdominal exam to visualize the endometrium and ovaries. COMPARISON:  08/09/2006 FINDINGS: Uterus Measurements: 8.5 x 4.0 x 4.4 cm. No evidence of discrete mass. Heterogeneous echotexture. Endometrium Thickness: 11 mm. Ill-defined and heterogeneous diffusely with small cystic components in the endometrium of the lower uterine segment. Endometrium in the lower uterine segment is vascular. Right ovary Not visualized Left ovary Measurements: 36 x 22 x 15 mm. Normal appearance/no adnexal mass. Other findings Small volume free fluid IMPRESSION: Sonographically heterogeneous myometrium suggesting possibility of diffuse fibroid involvement versus adenomyosis Irregular heterogeneous endometrium with hyperemia of the endometrium in the lower uterine segment. Differential diagnostic possibilities include hyperplasia, polyp, or carcinoma. Electronically Signed   By: Esperanza Heir M.D.   On: 09/27/2015 13:12     MDM UPT negative Declines pain or nausea meds at this time Assessment and Plan  A: 1. LLQ abdominal pain     P: Discharge home Rx reglan Message sent for f/u in clinic to discuss ultrasound report & symptoms  Judeth Horn 09/27/2015, 11:07 AM

## 2015-09-27 NOTE — Discharge Instructions (Signed)

## 2015-09-27 NOTE — MAU Note (Signed)
About a wk ago, passed a polyp.  (no prior hx).  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  Since then has been bloated and nauseated.  Yesterday started having severe LLQ pain, has continued today

## 2015-09-27 NOTE — MAU Note (Addendum)
Hx of inferility; hx of irregular periods and heavy periods; passed a polpy vaginally on 09-15-15; path on polpy is non-malignant; hx of always having bleeding after intercourse; is diabetic and has hypothroidism; worried and tearful; states that she does not have a GYN doctor because she does not have health insurance;

## 2015-10-15 ENCOUNTER — Encounter: Payer: Self-pay | Admitting: Obstetrics and Gynecology

## 2015-12-02 ENCOUNTER — Encounter: Payer: Self-pay | Admitting: Obstetrics & Gynecology

## 2015-12-02 ENCOUNTER — Ambulatory Visit (INDEPENDENT_AMBULATORY_CARE_PROVIDER_SITE_OTHER): Payer: Self-pay | Admitting: Obstetrics & Gynecology

## 2015-12-02 VITALS — BP 120/80 | HR 87 | Wt 215.5 lb

## 2015-12-02 DIAGNOSIS — N840 Polyp of corpus uteri: Secondary | ICD-10-CM

## 2015-12-02 DIAGNOSIS — N938 Other specified abnormal uterine and vaginal bleeding: Secondary | ICD-10-CM

## 2015-12-02 NOTE — Progress Notes (Signed)
Patient ID: Terri [name], female   DOB: 06/12/1971, 45 y.o.   MRN: [id number]  Chief Complaint  Patient presents with  . New Patient (Initial Visit)    Abdominal pain  heavy bleeding and passed polyp benign pap  HPI Terri PaliLaurie M Aliberti is a 45 y.o. female.  [id number]   HPI  Past Medical History  Diagnosis Date  . Diabetes mellitus without complication (HCC)   . Hypothyroidism   . Dysmenorrhea   . Infertility management     Past Surgical History  Procedure Laterality Date  . Cesarean section    . Cholecystectomy    . Ankle surgery    . Ganglion cyst excision      Family History  Problem Relation Age of Onset  . Diabetes Mother   . Heart disease Mother   . Hypertension Mother   . Diabetes Father   . Hypertension Father   . Diabetes Brother   . Hypertension Brother   . Diabetes Paternal Grandmother   . Hypertension Paternal Grandmother     Social History Social History  Substance Use Topics  . Smoking status: Never Smoker   . Smokeless tobacco: None  . Alcohol Use: No    No Known Allergies  Current Outpatient Prescriptions  Medication Sig Dispense Refill  . ALPRAZolam (XANAX) 0.25 MG tablet   0  . FLUoxetine (PROZAC) 20 MG capsule Take 20 mg by mouth daily.    . Liraglutide (VICTOZA) 18 MG/3ML SOPN Inject 1.8 mg into the skin daily.     Marland Kitchen. lisinopril (PRINIVIL,ZESTRIL) 10 MG tablet Take 10 mg by mouth daily.    . metoCLOPramide (REGLAN) 10 MG tablet Take 1 tablet (10 mg total) by mouth every 6 (six) hours. 30 tablet 0  . SitaGLIPtin-MetFORMIN HCl (JANUMET XR) [phone] MG TB24 Take 1 tablet by mouth daily.     No current facility-administered medications for this visit.    Review of Systems Review of Systems  Genitourinary: Positive for menstrual problem. Negative for vaginal bleeding, vaginal discharge and pelvic pain.    Blood pressure 120/80, pulse 87, weight 215 lb 8 oz (97.75 kg).  Physical Exam Physical Exam  Constitutional: She is oriented to  person, place, and time. She appears well-developed. No distress.  Pulmonary/Chest: Effort normal.  Genitourinary: Vagina normal and uterus normal. No vaginal discharge found.  Cervix normal and no masses  Neurological: She is alert and oriented to person, place, and time.  Skin: Skin is warm and dry. No pallor.  Psychiatric: She has a normal mood and affect. Her behavior is normal.    Data Reviewed  CLINICAL DATA: Left lower quadrant abdominal pain, irregular bleeding, patient states she passed a polyp vaginal a 1 week ago which was evaluated histologically and reportedly benign  EXAM: TRANSABDOMINAL AND TRANSVAGINAL ULTRASOUND OF PELVIS  TECHNIQUE: Both transabdominal and transvaginal ultrasound examinations of the pelvis were performed. Transabdominal technique was performed for global imaging of the pelvis including uterus, ovaries, adnexal regions, and pelvic cul-de-sac. It was necessary to proceed with endovaginal exam following the transabdominal exam to visualize the endometrium and ovaries.  COMPARISON: 08/09/2006  FINDINGS: Uterus  Measurements: 8.5 x 4.0 x 4.4 cm. No evidence of discrete mass. Heterogeneous echotexture.  Endometrium  Thickness: 11 mm. Ill-defined and heterogeneous diffusely with small cystic components in the endometrium of the lower uterine segment. Endometrium in the lower uterine segment is vascular.  Right ovary  Not visualized  Left ovary  Measurements: 36 x 22 x 15 mm. Normal  appearance/no adnexal mass.  Other findings  Small volume free fluid  IMPRESSION: Sonographically heterogeneous myometrium suggesting possibility of diffuse fibroid involvement versus adenomyosis  Irregular heterogeneous endometrium with hyperemia of the endometrium in the lower uterine segment. Differential diagnostic possibilities include hyperplasia, polyp, or carcinoma.   Electronically Signed  By: Esperanza Heir M.D.  On:  09/27/2015 13:12 Assessment    DUB after passage of benign endometrial polyp States pap is up to date     Plan    RTC if further DUB sx Yearly exam, pap and mammogram        ARNOLD,JAMES 12/02/2015, 2:55 PM

## 2015-12-02 NOTE — Patient Instructions (Signed)

## 2016-09-18 ENCOUNTER — Encounter: Payer: Self-pay | Admitting: Gastroenterology

## 2016-11-20 IMAGING — US US PELVIS COMPLETE
1 series · 15 of 25 positions shown · non-contrast
Comparison: 08/09/2006

CLINICAL DATA: Left lower quadrant abdominal pain, irregular
bleeding, patient states she passed a polyp vaginal a 1 week ago
which was evaluated histologically and reportedly benign

EXAM:
TRANSABDOMINAL AND TRANSVAGINAL ULTRASOUND OF PELVIS
TECHNIQUE: Both transabdominal and transvaginal ultrasound examinations of the
pelvis were performed. Transabdominal technique was performed for
global imaging of the pelvis including uterus, ovaries, adnexal
regions, and pelvic cul-de-sac. It was necessary to proceed with
endovaginal exam following the transabdominal exam to visualize the
endometrium and ovaries.

[Series 1: us pelvis complete · 15 of 95 slices shown]
[im 1/95]
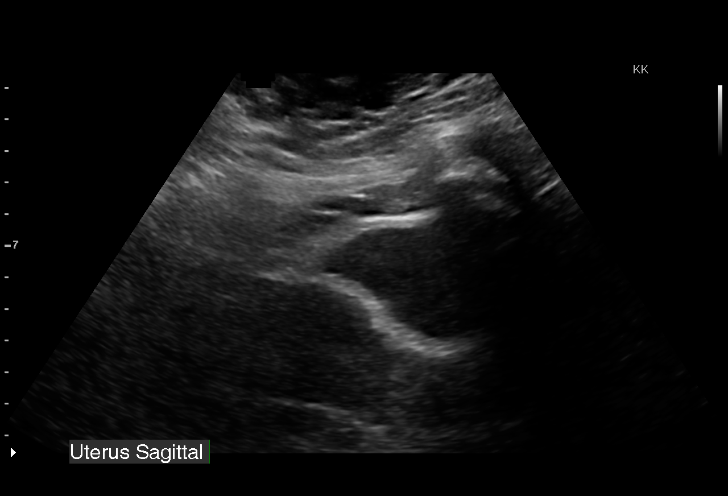
[im 8/95]
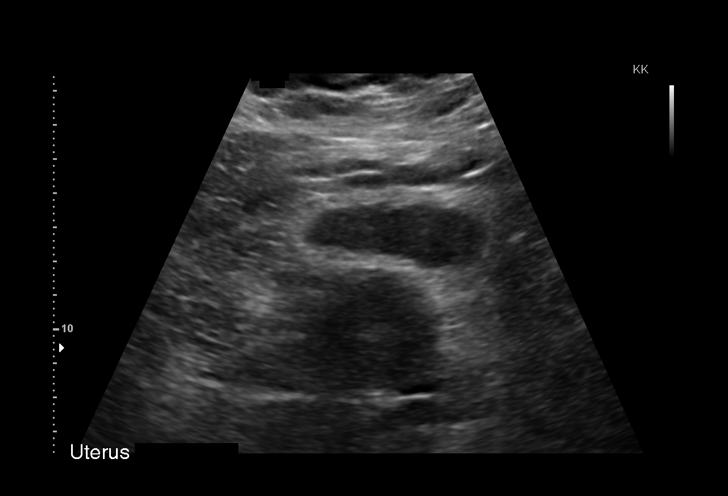
[im 16/95]
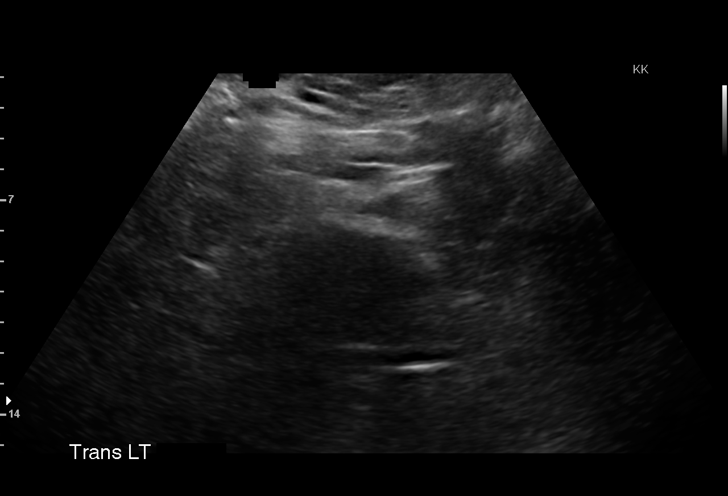
[im 20/95]
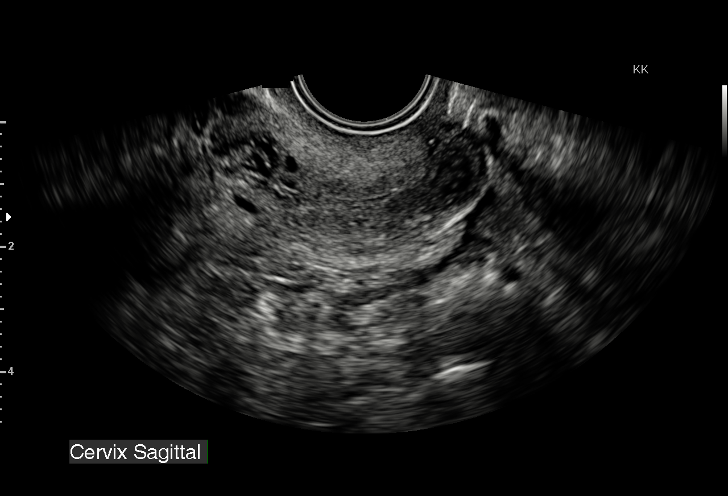
[im 28/95]
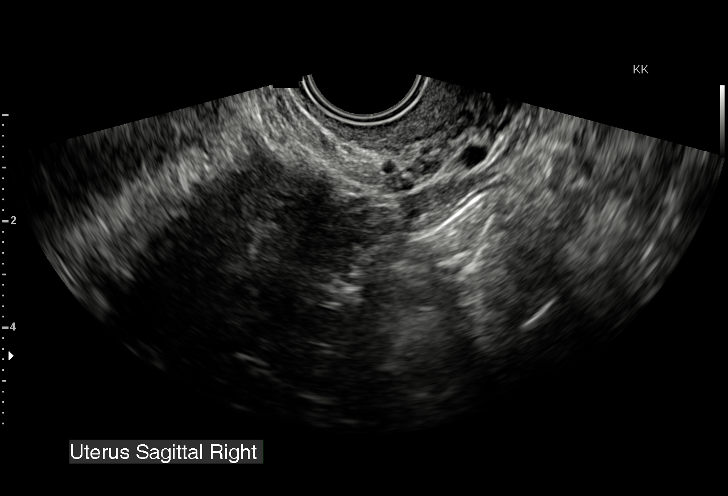
[im 36/95]
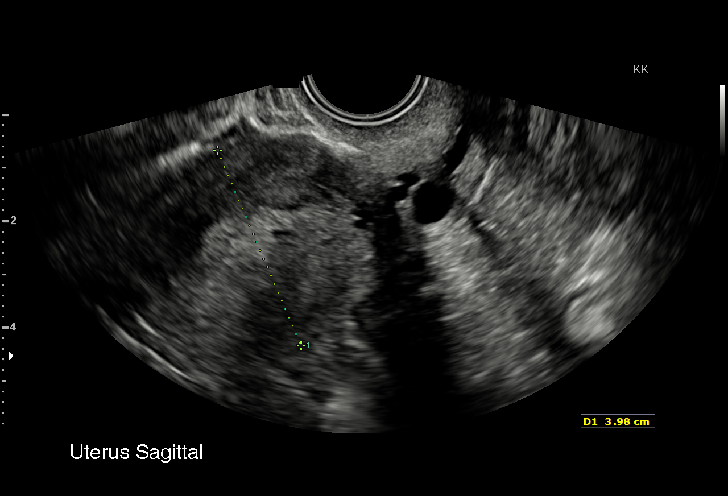
[im 40/95]
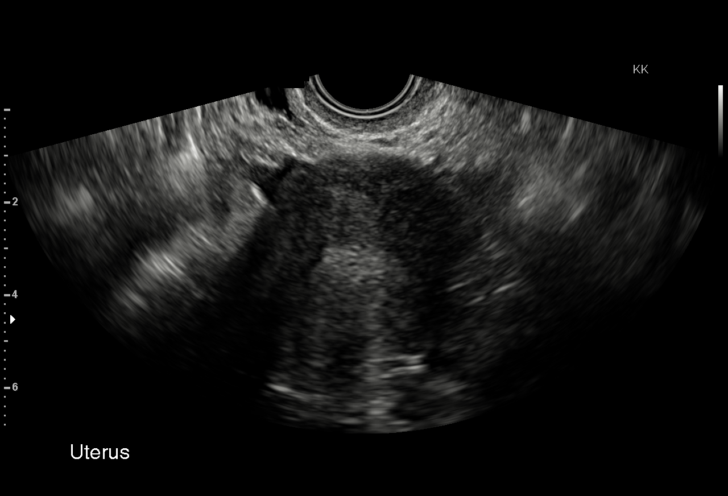
[im 48/95]
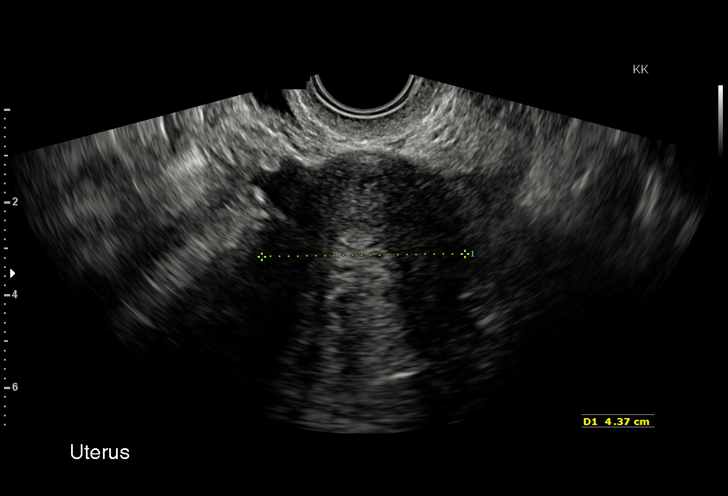
[im 55/95]
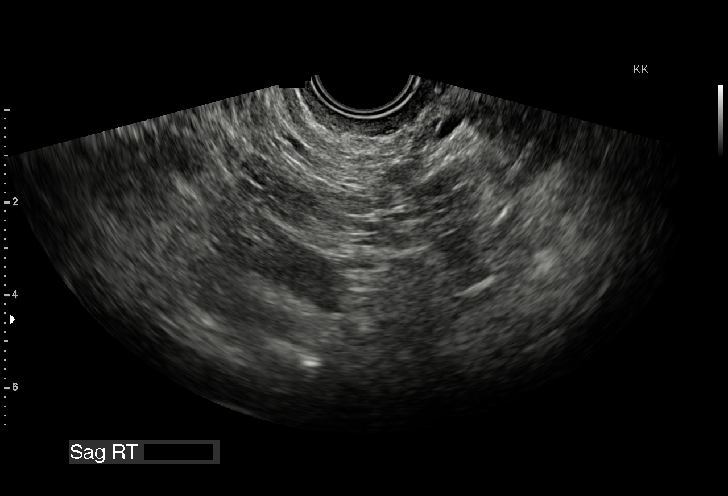
[im 59/95]
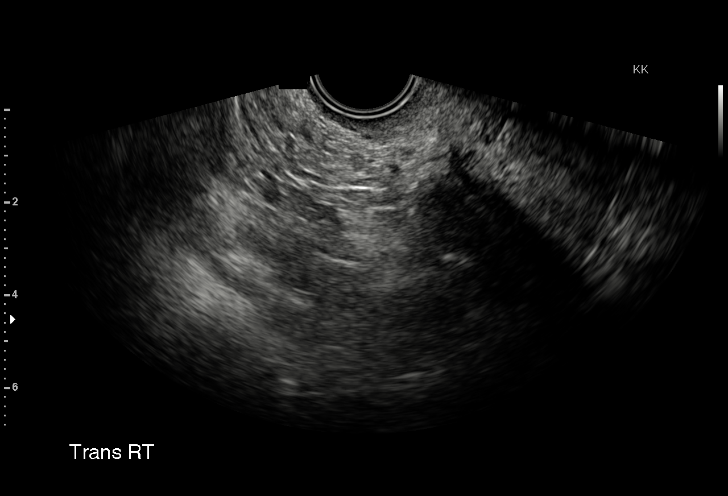
[im 67/95]
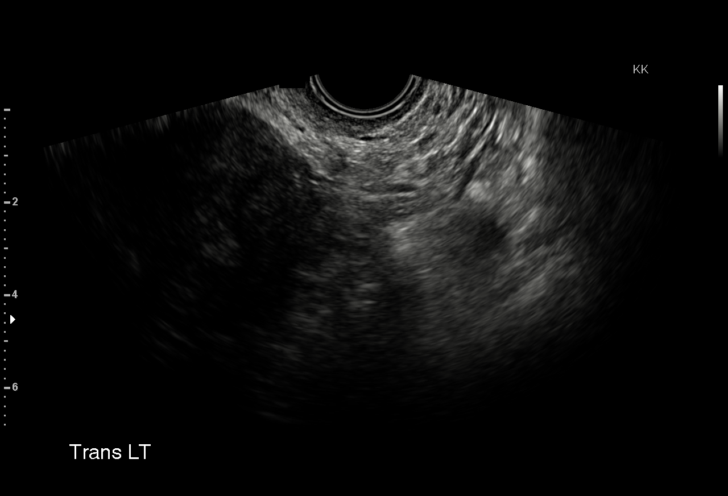
[im 75/95]
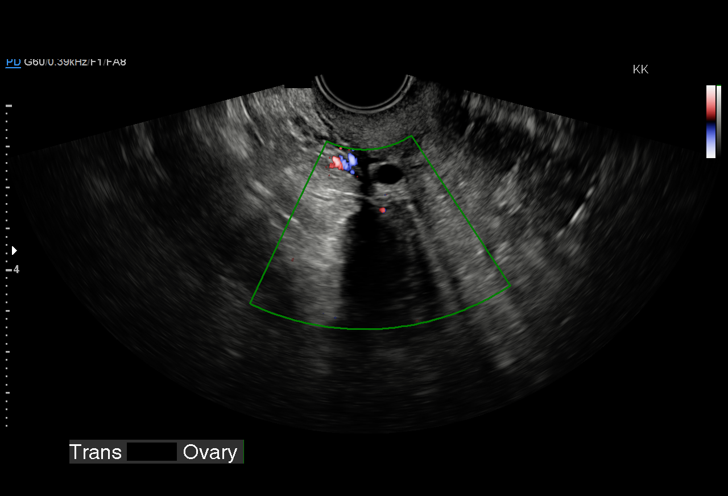
[im 79/95]
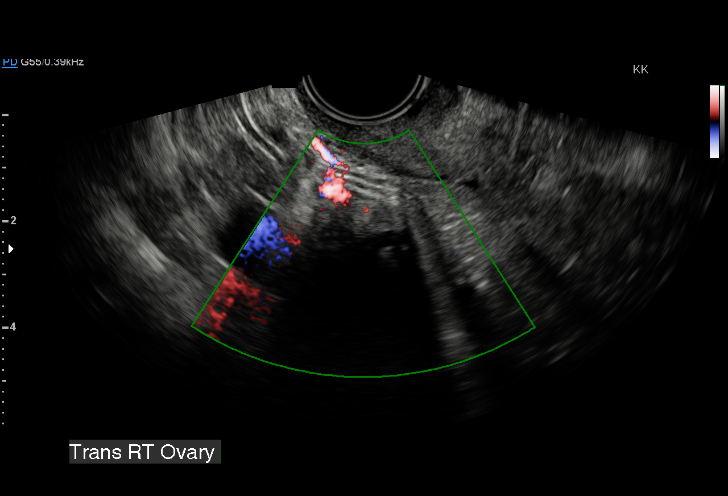
[im 87/95]
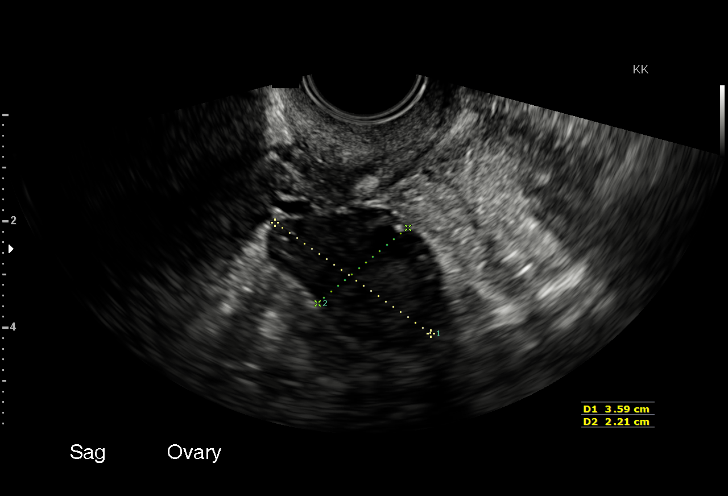
[im 95/95]
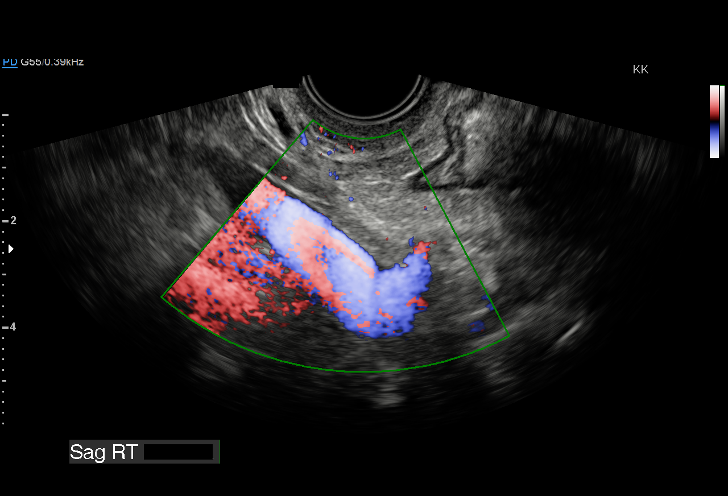

[15 of 25 positions shown; findings below may reference images not displayed]

FINDINGS: Uterus

Measurements: 8.5 x 4.0 x 4.4 cm. No evidence of discrete mass.
Heterogeneous echotexture.

Endometrium

Thickness: 11 mm. Ill-defined and heterogeneous diffusely with small
cystic components in the endometrium of the lower uterine segment.
Endometrium in the lower uterine segment is vascular.

Right ovary

Not visualized

Left ovary

Measurements: 36 x 22 x 15 mm. Normal appearance/no adnexal mass.

Other findings

Small volume free fluid
IMPRESSION: Sonographically heterogeneous myometrium suggesting possibility of
diffuse fibroid involvement versus adenomyosis

Irregular heterogeneous endometrium with hyperemia of the
endometrium in the lower uterine segment. Differential diagnostic
possibilities include hyperplasia, polyp, or carcinoma.
